# Patient Record
Sex: Male | Born: 1955 | Race: Black or African American | Hispanic: No | Marital: Single | Smoking: Current some day smoker
Health system: Southern US, Community
[De-identification: ages and names within clinical notes are randomized; demographics above are authoritative.]

## PROBLEM LIST (undated history)

## (undated) DIAGNOSIS — I10 Essential (primary) hypertension: Secondary | ICD-10-CM

## (undated) DIAGNOSIS — Z8619 Personal history of other infectious and parasitic diseases: Secondary | ICD-10-CM

## (undated) DIAGNOSIS — M1990 Unspecified osteoarthritis, unspecified site: Secondary | ICD-10-CM

## (undated) DIAGNOSIS — M2550 Pain in unspecified joint: Secondary | ICD-10-CM

## (undated) HISTORY — PX: OTHER SURGICAL HISTORY: SHX169

---

## 2000-05-24 ENCOUNTER — Other Ambulatory Visit: Admission: RE | Admit: 2000-05-24 | Discharge: 2000-05-24 | Payer: Self-pay | Admitting: Oncology

## 2000-06-18 ENCOUNTER — Encounter: Admission: RE | Admit: 2000-06-18 | Discharge: 2000-06-18 | Payer: Self-pay | Admitting: Neurosurgery

## 2000-06-18 ENCOUNTER — Encounter: Payer: Self-pay | Admitting: Neurosurgery

## 2000-06-20 ENCOUNTER — Encounter: Payer: Self-pay | Admitting: Neurosurgery

## 2000-06-20 ENCOUNTER — Inpatient Hospital Stay (HOSPITAL_COMMUNITY): Admission: AD | Admit: 2000-06-20 | Discharge: 2000-06-25 | Payer: Self-pay

## 2000-06-21 ENCOUNTER — Encounter: Payer: Self-pay | Admitting: Internal Medicine

## 2000-06-22 ENCOUNTER — Encounter: Payer: Self-pay | Admitting: Internal Medicine

## 2000-07-24 ENCOUNTER — Encounter: Admission: RE | Admit: 2000-07-24 | Discharge: 2000-07-24 | Payer: Self-pay | Admitting: Infectious Diseases

## 2000-07-31 ENCOUNTER — Encounter: Payer: Self-pay | Admitting: Neurosurgery

## 2000-07-31 ENCOUNTER — Ambulatory Visit (HOSPITAL_COMMUNITY): Admission: RE | Admit: 2000-07-31 | Discharge: 2000-07-31 | Payer: Self-pay | Admitting: Neurosurgery

## 2000-08-08 ENCOUNTER — Emergency Department (HOSPITAL_COMMUNITY): Admission: EM | Admit: 2000-08-08 | Discharge: 2000-08-08 | Payer: Self-pay | Admitting: Emergency Medicine

## 2000-08-15 ENCOUNTER — Encounter: Admission: RE | Admit: 2000-08-15 | Discharge: 2000-08-15 | Payer: Self-pay | Admitting: Infectious Diseases

## 2000-09-12 ENCOUNTER — Encounter: Admission: RE | Admit: 2000-09-12 | Discharge: 2000-09-12 | Payer: Self-pay | Admitting: Neurosurgery

## 2000-09-12 ENCOUNTER — Encounter: Payer: Self-pay | Admitting: Neurosurgery

## 2000-09-12 ENCOUNTER — Encounter: Admission: RE | Admit: 2000-09-12 | Discharge: 2000-09-12 | Payer: Self-pay | Admitting: Infectious Diseases

## 2000-11-07 ENCOUNTER — Encounter: Admission: RE | Admit: 2000-11-07 | Discharge: 2000-11-07 | Payer: Self-pay | Admitting: Infectious Diseases

## 2000-11-14 ENCOUNTER — Encounter: Payer: Self-pay | Admitting: Neurosurgery

## 2000-11-14 ENCOUNTER — Encounter: Admission: RE | Admit: 2000-11-14 | Discharge: 2000-11-14 | Payer: Self-pay | Admitting: Neurosurgery

## 2012-10-02 ENCOUNTER — Other Ambulatory Visit: Payer: Self-pay | Admitting: Orthopedic Surgery

## 2012-10-10 ENCOUNTER — Encounter (HOSPITAL_COMMUNITY): Payer: Self-pay | Admitting: Pharmacy Technician

## 2012-10-11 NOTE — Pre-Procedure Instructions (Signed)
Mitchell Curry  10/11/2012   Your procedure is scheduled on:  Fri, Sept 12 @ 12:15 PM  Report to [facility] at 10:15 AM.  Call this number if you have problems the morning of surgery: [phone]   Remember:   Do not eat food or drink liquids after midnight.   Take these medicines the morning of surgery with A SIP OF WATER: Carvedilol(Coreg)               Stop taking your Aleve.No Goody's,BC's,Ibuprofen,Aspirin,Fish Oil,or any Herbal Medications   Do not wear jewelry  Do not wear lotions, powders, or colognes. You may wear deodorant.  Men may shave face and neck.  Do not bring valuables to the hospital.  [facility] is not responsible                   for any belongings or valuables.  Contacts, dentures or bridgework may not be worn into surgery.  Leave suitcase in the car. After surgery it may be brought to your room.  For patients admitted to the hospital, checkout time is 11:00 AM the day of  discharge.   Special Instructions: Shower using CHG 2 nights before surgery and the night before surgery.  If you shower the day of surgery use CHG.  Use special wash - you have one bottle of CHG for all showers.  You should use approximately 1/3 of the bottle for each shower.   Please read over the following fact sheets that you were given: Pain Booklet, Coughing and Deep Breathing, Blood Transfusion Information, Total Joint Packet, MRSA Information and Surgical Site Infection Prevention

## 2012-10-14 ENCOUNTER — Encounter (HOSPITAL_COMMUNITY)
Admission: RE | Admit: 2012-10-14 | Discharge: 2012-10-14 | Disposition: A | Discharge status: Home / Self Care | Payer: Managed Care, Other (non HMO) | Source: Ambulatory Visit | Attending: Orthopedic Surgery | Admitting: Orthopedic Surgery

## 2012-10-14 ENCOUNTER — Encounter (HOSPITAL_COMMUNITY): Payer: Self-pay

## 2012-10-14 DIAGNOSIS — Z01812 Encounter for preprocedural laboratory examination: Secondary | ICD-10-CM | POA: Insufficient documentation

## 2012-10-14 DIAGNOSIS — Z01818 Encounter for other preprocedural examination: Secondary | ICD-10-CM | POA: Insufficient documentation

## 2012-10-14 DIAGNOSIS — Z01810 Encounter for preprocedural cardiovascular examination: Secondary | ICD-10-CM | POA: Insufficient documentation

## 2012-10-14 HISTORY — DX: Unspecified osteoarthritis, unspecified site: M19.90

## 2012-10-14 HISTORY — DX: Pain in unspecified joint: M25.50

## 2012-10-14 HISTORY — DX: Essential (primary) hypertension: I10

## 2012-10-14 HISTORY — DX: Personal history of other infectious and parasitic diseases: Z86.19

## 2012-10-14 LAB — SURGICAL PCR SCREEN
MRSA, PCR: NEGATIVE
Staphylococcus aureus: NEGATIVE

## 2012-10-14 LAB — URINALYSIS, ROUTINE W REFLEX MICROSCOPIC
Hgb urine dipstick: NEGATIVE
Nitrite: NEGATIVE
Specific Gravity, Urine: 1.01 (ref 1.005–1.030)
Urobilinogen, UA: 0.2 mg/dL (ref 0.0–1.0)
pH: 7.5 (ref 5.0–8.0)

## 2012-10-14 LAB — BASIC METABOLIC PANEL
BUN: 13 mg/dL (ref 6–23)
CO2: 26 mEq/L (ref 19–32)
Calcium: 10.1 mg/dL (ref 8.4–10.5)
Chloride: 101 mEq/L (ref 96–112)
Creatinine, Ser: 0.97 mg/dL (ref 0.50–1.35)
Glucose, Bld: 100 mg/dL — ABNORMAL HIGH (ref 70–99)

## 2012-10-14 LAB — CBC WITH DIFFERENTIAL/PLATELET
Basophils Absolute: 0 10*3/uL (ref 0.0–0.1)
Eosinophils Absolute: 0.4 10*3/uL (ref 0.0–0.7)
Eosinophils Relative: 7 % — ABNORMAL HIGH (ref 0–5)
HCT: 37.6 % — ABNORMAL LOW (ref 39.0–52.0)
Lymphocytes Relative: 48 % — ABNORMAL HIGH (ref 12–46)
MCH: 33.3 pg (ref 26.0–34.0)
MCHC: 36.7 g/dL — ABNORMAL HIGH (ref 30.0–36.0)
MCV: 90.8 fL (ref 78.0–100.0)
Monocytes Absolute: 0.7 10*3/uL (ref 0.1–1.0)
RDW: 12.7 % (ref 11.5–15.5)
WBC: 5.1 10*3/uL (ref 4.0–10.5)

## 2012-10-14 LAB — TYPE AND SCREEN: Antibody Screen: NEGATIVE

## 2012-10-14 LAB — ABO/RH: ABO/RH(D): O POS

## 2012-10-14 MED ORDER — CHLORHEXIDINE GLUCONATE 4 % EX LIQD: 60.0000 mL | Freq: Once | CUTANEOUS | Status: DC

## 2012-10-14 NOTE — Progress Notes (Signed)
Pt doesn't have a cardiologist  Denies ever having an echo/stress test/heart cath  Medical MD is Dr.Vyas  Denies ekg or cxr in past yr

## 2012-10-15 ENCOUNTER — Other Ambulatory Visit: Payer: Self-pay | Admitting: Orthopedic Surgery

## 2012-10-15 NOTE — H&P (Signed)
TOTAL KNEE ADMISSION H&P  Patient is being admitted for left total knee arthroplasty.  Subjective:  Chief Complaint:left knee pain.  HPI: Mitchell Curry, 57 y.o. male, has a history of pain and functional disability in the left knee due to arthritis and has failed non-surgical conservative treatments for greater than 12 weeks to includeNSAID's and/or analgesics and activity modification.  Onset of symptoms wa s gradual, starting many years ago with gradually worsening course since that time. The patient noted no past surgery on the left knee(s).  Patient currently rates pain in the left knee(s) at severe with activity. Patient has night pain, worsening of pain with activity and weight bearing, pain that interferes with activities of daily living and pain with passive range of motion.  Patient has evidence of joint subluxation and joint space narrowing by imaging studies.  There is no active infection.  Patient is seen in consultation from Dr. Salvatore Marvel for end-stage arthritis the left knee with far varus deformity, as well as some stretching of the lateral collateral ligament with lateral knee pain as well as mid medial knee pain now.  He works at a [employer], [employer]. He stands on his feet 10-12 hours a day and reports that the knee is becoming more unstable and painful.  He can only walk one to two blocks before he must sit down.  The pain wakes him at night and if he goes shopping, he holds onto the shopping cart.  Patient denies any history of heart attack, stroke, or significant cardiovascular disease but does have a history of hypertension that is controlled.  There are no active problems to display for this patient.  Past Medical History  Diagnosis Date  . Hypertension     takes Carvedilol daily  . Arthritis   . Joint pain   . History of shingles     Past Surgical History  Procedure Laterality Date  . Right wrist surgery       x 2    No prescriptions prior to  admission   No Known Allergies  History  Substance Use Topics  . Smoking status: Current Some Day Smoker    Types: Cigars  . Smokeless tobacco: Not on file  . Alcohol Use: Yes     Comment: beer on weekends    No family history on file.   Review of Systems  Constitutional: Negative.   HENT: Negative.   Eyes: Negative.   Respiratory: Negative.   Gastrointestinal: Negative.   Musculoskeletal: Positive for joint pain (left knee).  Skin: Negative.   Neurological: Negative.   Psychiatric/Behavioral: Negative.     Objective:  Physical Exam  Constitutional: He is oriented to person, place, and time. He appears well-developed and well-nourished.  HENT:  Head: Normocephalic and atraumatic.  Eyes: EOM are normal. Pupils are equal, round, and reactive to light.  Neck: Normal range of motion. Neck supple.  Cardiovascular: Intact distal pulses.   Respiratory: Effort normal and breath sounds normal.  Musculoskeletal: He exhibits tenderness (left knee pain).  Neurological: He is alert and oriented to person, place, and time. He has normal reflexes.  Skin: Skin is warm and dry.  Psychiatric: He has a normal mood and affect. His behavior is normal. Judgment and thought content normal.    Vital signs in last 24 hours:    Labs:   There is no height or weight on file to calculate BMI.   Imaging Review X-rays from the [facility] are reviewed and  show medial compartment end-stage arthritis, 1 cm lateral subluxation of tibia beneath the femur, as well as 20-30% he'll elongate of the lateral collateral ligament.  Assessment/Plan:   End-stage arthritis left knee, far varus deformity, early stress of the lateral collateral ligament from the far varus deformity  The patient history, physical examination, clinical judgment of the provider and imaging studies are consistent with end stage degenerative joint disease of the left knee(s) and total knee arthroplasty is deemed  medically necessary. The treatment options including medical management, injection therapy arthroscopy and arthroplasty were discussed at length. The risks and benefits of total knee arthroplasty were presented and reviewed. The risks due to aseptic loosening, infection, stiffness, patella tracking problems, thromboembolic complications and other imponderables were discussed. The patient acknowledged the explanation, agreed to proceed with the plan and consent was signed. Patient is being admitted for inpatient treatment for surgery, pain control, PT, OT, prophylactic antibiotics, VTE prophylaxis, progressive ambulation and ADL's and discharge planning. The patient is planning to be discharged home with home health services  The patient should plan for knee replacement sooner than later to protect his lateral collateral ligament.  Models were brought into the room the procedure, risks and benefits were discussed with the patient.  We will also have the T C3 femur stem and MBT trays available to stabilize the lateral side of the joint if needed.

## 2012-10-15 NOTE — Progress Notes (Addendum)
Anesthesia chart review:  Patient is a 57 year old male scheduled for left TKA on 10/18/12 by Dr. Turner Daniels.  History includes smoking, HTN, shingles, arthritis.  PCP is Dr. Sherril Croon.  EKG on 10/14/12 showed SB @ 56 bpm, possible LAE, LVH.  CXR on 10/14/12 showed: Hyperinflated lungs with question nipple shadows; repeat PA chest radiograph with nipple markers recommended to exclude pulmonary nodule.  Report called to Agustin Cree at Dr. Wadie Lessen office.  Defer orders/recommendations to Dr. Turner Daniels.  Preoperative labs noted.    Anticipate that he can proceed as planned.  [facility]/Anesthesiology Phone [phone] 10/15/2012 10:33 AM  Addendum: 10/15/2012 1:39 PM Dr. Turner Daniels is going to have patient repeat CXR with nipple markers on the day of surgery.

## 2012-10-17 MED ORDER — CEFAZOLIN SODIUM-DEXTROSE 2-3 GM-% IV SOLR
2.0000 g | INTRAVENOUS | Status: AC
  Administered 2012-10-18: 2 g via INTRAVENOUS
  Filled 2012-10-17: qty 50

## 2012-10-17 NOTE — Progress Notes (Signed)
Surgery time change noted pt called and informed to be here at 0845 for surgery at 1045. Teach back complete.

## 2012-10-18 ENCOUNTER — Encounter (HOSPITAL_COMMUNITY): Payer: Self-pay | Admitting: Vascular Surgery

## 2012-10-18 ENCOUNTER — Encounter (HOSPITAL_COMMUNITY): Payer: Self-pay | Admitting: *Deleted

## 2012-10-18 ENCOUNTER — Inpatient Hospital Stay (HOSPITAL_COMMUNITY): Payer: Managed Care, Other (non HMO)

## 2012-10-18 ENCOUNTER — Encounter (HOSPITAL_COMMUNITY)
Admission: RE | Disposition: A | Discharge status: Home / Self Care | Payer: Self-pay | Source: Ambulatory Visit | Attending: Orthopedic Surgery

## 2012-10-18 ENCOUNTER — Inpatient Hospital Stay (HOSPITAL_COMMUNITY)
Admission: RE | Admit: 2012-10-18 | Discharge: 2012-10-20 | DRG: 470 | Disposition: A | Discharge status: Home / Self Care | Payer: Managed Care, Other (non HMO) | Source: Ambulatory Visit | Attending: Orthopedic Surgery | Admitting: Orthopedic Surgery

## 2012-10-18 ENCOUNTER — Inpatient Hospital Stay (HOSPITAL_COMMUNITY): Payer: Managed Care, Other (non HMO) | Admitting: Anesthesiology

## 2012-10-18 DIAGNOSIS — I10 Essential (primary) hypertension: Secondary | ICD-10-CM | POA: Diagnosis present

## 2012-10-18 DIAGNOSIS — M1710 Unilateral primary osteoarthritis, unspecified knee: Principal | ICD-10-CM | POA: Diagnosis present

## 2012-10-18 DIAGNOSIS — M1712 Unilateral primary osteoarthritis, left knee: Secondary | ICD-10-CM | POA: Diagnosis present

## 2012-10-18 DIAGNOSIS — F17200 Nicotine dependence, unspecified, uncomplicated: Secondary | ICD-10-CM | POA: Diagnosis present

## 2012-10-18 HISTORY — PX: TOTAL KNEE ARTHROPLASTY: SHX125

## 2012-10-18 SURGERY — ARTHROPLASTY, KNEE, TOTAL
Anesthesia: Regional | Site: Knee | Laterality: Left | Wound class: Clean

## 2012-10-18 MED ORDER — KCL IN DEXTROSE-NACL 20-5-0.45 MEQ/L-%-% IV SOLN
INTRAVENOUS | Status: DC
  Administered 2012-10-18 – 2012-10-19 (×4): via INTRAVENOUS
  Filled 2012-10-18 (×8): qty 1000

## 2012-10-18 MED ORDER — HYDRALAZINE HCL 20 MG/ML IJ SOLN
INTRAMUSCULAR | Status: AC
  Filled 2012-10-18: qty 1

## 2012-10-18 MED ORDER — FENTANYL CITRATE 0.05 MG/ML IJ SOLN
INTRAMUSCULAR | Status: DC | PRN
  Administered 2012-10-18 (×2): 50 ug via INTRAVENOUS
  Administered 2012-10-18: 100 ug via INTRAVENOUS
  Administered 2012-10-18: 50 ug via INTRAVENOUS

## 2012-10-18 MED ORDER — CEFUROXIME SODIUM 1.5 G IJ SOLR
INTRAMUSCULAR | Status: DC | PRN
  Administered 2012-10-18: 1.5 g

## 2012-10-18 MED ORDER — LACTATED RINGERS IV SOLN
INTRAVENOUS | Status: DC | PRN
  Administered 2012-10-18 (×2): via INTRAVENOUS

## 2012-10-18 MED ORDER — DOCUSATE SODIUM 100 MG PO CAPS
100.0000 mg | ORAL_CAPSULE | Freq: Two times a day (BID) | ORAL | Status: DC
  Administered 2012-10-18 – 2012-10-20 (×4): 100 mg via ORAL
  Filled 2012-10-18 (×5): qty 1

## 2012-10-18 MED ORDER — BUPIVACAINE LIPOSOME 1.3 % IJ SUSP
20.0000 mL | INTRAMUSCULAR | Status: DC
  Filled 2012-10-18: qty 20

## 2012-10-18 MED ORDER — 0.9 % SODIUM CHLORIDE (POUR BTL) OPTIME
TOPICAL | Status: DC | PRN
  Administered 2012-10-18: 1000 mL

## 2012-10-18 MED ORDER — METHOCARBAMOL 500 MG PO TABS
500.0000 mg | ORAL_TABLET | Freq: Four times a day (QID) | ORAL | Status: DC | PRN
  Administered 2012-10-18 – 2012-10-20 (×5): 500 mg via ORAL
  Filled 2012-10-18 (×7): qty 1

## 2012-10-18 MED ORDER — LABETALOL HCL 5 MG/ML IV SOLN
5.0000 mg | INTRAVENOUS | Status: DC | PRN
  Administered 2012-10-18 (×2): 5 mg via INTRAVENOUS

## 2012-10-18 MED ORDER — METOCLOPRAMIDE HCL 5 MG PO TABS
5.0000 mg | ORAL_TABLET | Freq: Three times a day (TID) | ORAL | Status: DC | PRN
  Filled 2012-10-18: qty 2

## 2012-10-18 MED ORDER — ASPIRIN EC 325 MG PO TBEC
325.0000 mg | DELAYED_RELEASE_TABLET | Freq: Every day | ORAL | Status: DC
  Administered 2012-10-19 – 2012-10-20 (×2): 325 mg via ORAL
  Filled 2012-10-18 (×3): qty 1

## 2012-10-18 MED ORDER — OXYCODONE HCL 5 MG/5ML PO SOLN: 5.0000 mg | Freq: Once | ORAL | Status: DC | PRN

## 2012-10-18 MED ORDER — OXYCODONE-ACETAMINOPHEN 5-325 MG PO TABS: 1.0000 | ORAL_TABLET | ORAL | Status: AC | PRN

## 2012-10-18 MED ORDER — ALUM & MAG HYDROXIDE-SIMETH 200-200-20 MG/5ML PO SUSP
30.0000 mL | ORAL | Status: DC | PRN
  Filled 2012-10-18: qty 30

## 2012-10-18 MED ORDER — ONDANSETRON HCL 4 MG/2ML IJ SOLN: 4.0000 mg | Freq: Four times a day (QID) | INTRAMUSCULAR | Status: DC | PRN

## 2012-10-18 MED ORDER — LACTATED RINGERS IV SOLN
INTRAVENOUS | Status: DC
  Administered 2012-10-18: 10:00:00 via INTRAVENOUS

## 2012-10-18 MED ORDER — FENTANYL CITRATE 0.05 MG/ML IJ SOLN
25.0000 ug | INTRAMUSCULAR | Status: DC | PRN
  Administered 2012-10-18 (×4): 25 ug via INTRAVENOUS

## 2012-10-18 MED ORDER — METOCLOPRAMIDE HCL 5 MG/ML IJ SOLN: 5.0000 mg | Freq: Three times a day (TID) | INTRAMUSCULAR | Status: DC | PRN

## 2012-10-18 MED ORDER — LIDOCAINE HCL (CARDIAC) 20 MG/ML IV SOLN
INTRAVENOUS | Status: DC | PRN
  Administered 2012-10-18: 80 mg via INTRAVENOUS

## 2012-10-18 MED ORDER — MIDAZOLAM HCL 5 MG/5ML IJ SOLN
INTRAMUSCULAR | Status: DC | PRN
  Administered 2012-10-18: 2 mg via INTRAVENOUS

## 2012-10-18 MED ORDER — DIPHENHYDRAMINE HCL 12.5 MG/5ML PO ELIX: 12.5000 mg | ORAL_SOLUTION | ORAL | Status: DC | PRN

## 2012-10-18 MED ORDER — ONDANSETRON HCL 4 MG/2ML IJ SOLN
INTRAMUSCULAR | Status: DC | PRN
  Administered 2012-10-18: 4 mg via INTRAVENOUS

## 2012-10-18 MED ORDER — ACETAMINOPHEN 650 MG RE SUPP: 650.0000 mg | Freq: Four times a day (QID) | RECTAL | Status: DC | PRN

## 2012-10-18 MED ORDER — HYDRALAZINE HCL 20 MG/ML IJ SOLN
10.0000 mg | Freq: Once | INTRAMUSCULAR | Status: AC
  Administered 2012-10-18: 10:00:00 via INTRAVENOUS
  Filled 2012-10-18: qty 0.5

## 2012-10-18 MED ORDER — MENTHOL 3 MG MT LOZG: 1.0000 | LOZENGE | OROMUCOSAL | Status: DC | PRN

## 2012-10-18 MED ORDER — NEOSTIGMINE METHYLSULFATE 1 MG/ML IJ SOLN
INTRAMUSCULAR | Status: DC | PRN
  Administered 2012-10-18: 3 mg via INTRAVENOUS

## 2012-10-18 MED ORDER — PROPOFOL 10 MG/ML IV BOLUS
INTRAVENOUS | Status: DC | PRN
  Administered 2012-10-18: 140 mg via INTRAVENOUS

## 2012-10-18 MED ORDER — METHOCARBAMOL 500 MG PO TABS: 500.0000 mg | ORAL_TABLET | Freq: Two times a day (BID) | ORAL | Status: AC

## 2012-10-18 MED ORDER — METHOCARBAMOL 100 MG/ML IJ SOLN
500.0000 mg | Freq: Four times a day (QID) | INTRAVENOUS | Status: DC | PRN
  Filled 2012-10-18: qty 5

## 2012-10-18 MED ORDER — EPHEDRINE SULFATE 50 MG/ML IJ SOLN
INTRAMUSCULAR | Status: DC | PRN
  Administered 2012-10-18: 10 mg via INTRAVENOUS

## 2012-10-18 MED ORDER — ACETAMINOPHEN 325 MG PO TABS: 650.0000 mg | ORAL_TABLET | Freq: Four times a day (QID) | ORAL | Status: DC | PRN

## 2012-10-18 MED ORDER — BUPIVACAINE-EPINEPHRINE PF 0.5-1:200000 % IJ SOLN
INTRAMUSCULAR | Status: DC | PRN
  Administered 2012-10-18: 20 mL

## 2012-10-18 MED ORDER — HYDROMORPHONE HCL PF 1 MG/ML IJ SOLN
1.0000 mg | INTRAMUSCULAR | Status: DC | PRN
  Administered 2012-10-18 – 2012-10-19 (×4): 1 mg via INTRAVENOUS
  Filled 2012-10-18 (×4): qty 1

## 2012-10-18 MED ORDER — PHENYLEPHRINE HCL 10 MG/ML IJ SOLN
INTRAMUSCULAR | Status: DC | PRN
  Administered 2012-10-18 (×4): 80 ug via INTRAVENOUS

## 2012-10-18 MED ORDER — SENNOSIDES-DOCUSATE SODIUM 8.6-50 MG PO TABS: 1.0000 | ORAL_TABLET | Freq: Every evening | ORAL | Status: DC | PRN

## 2012-10-18 MED ORDER — BISACODYL 5 MG PO TBEC: 5.0000 mg | DELAYED_RELEASE_TABLET | Freq: Every day | ORAL | Status: DC | PRN

## 2012-10-18 MED ORDER — PHENOL 1.4 % MT LIQD: 1.0000 | OROMUCOSAL | Status: DC | PRN

## 2012-10-18 MED ORDER — ONDANSETRON HCL 4 MG PO TABS
4.0000 mg | ORAL_TABLET | Freq: Four times a day (QID) | ORAL | Status: DC | PRN
  Administered 2012-10-19: 4 mg via ORAL
  Filled 2012-10-18: qty 1

## 2012-10-18 MED ORDER — SODIUM CHLORIDE 0.9 % IJ SOLN
INTRAMUSCULAR | Status: DC | PRN
  Administered 2012-10-18: 12:00:00

## 2012-10-18 MED ORDER — CARVEDILOL 25 MG PO TABS
25.0000 mg | ORAL_TABLET | Freq: Two times a day (BID) | ORAL | Status: DC
  Filled 2012-10-18 (×2): qty 1

## 2012-10-18 MED ORDER — OXYCODONE HCL 5 MG PO TABS
5.0000 mg | ORAL_TABLET | ORAL | Status: DC | PRN
  Administered 2012-10-18 – 2012-10-20 (×7): 10 mg via ORAL
  Filled 2012-10-18 (×8): qty 2

## 2012-10-18 MED ORDER — OXYCODONE HCL 5 MG PO TABS: 5.0000 mg | ORAL_TABLET | Freq: Once | ORAL | Status: DC | PRN

## 2012-10-18 MED ORDER — FENTANYL CITRATE 0.05 MG/ML IJ SOLN
50.0000 ug | INTRAMUSCULAR | Status: DC | PRN
  Administered 2012-10-18: 50 ug via INTRAVENOUS
  Filled 2012-10-18: qty 2

## 2012-10-18 MED ORDER — FENTANYL CITRATE 0.05 MG/ML IJ SOLN
INTRAMUSCULAR | Status: AC
  Filled 2012-10-18: qty 2

## 2012-10-18 MED ORDER — ROCURONIUM BROMIDE 100 MG/10ML IV SOLN
INTRAVENOUS | Status: DC | PRN
  Administered 2012-10-18: 50 mg via INTRAVENOUS

## 2012-10-18 MED ORDER — MAGNESIUM CITRATE PO SOLN
1.0000 | Freq: Once | ORAL | Status: AC | PRN
  Filled 2012-10-18: qty 296

## 2012-10-18 MED ORDER — CARVEDILOL 25 MG PO TABS
25.0000 mg | ORAL_TABLET | Freq: Two times a day (BID) | ORAL | Status: DC
  Administered 2012-10-18 – 2012-10-20 (×4): 25 mg via ORAL
  Filled 2012-10-18 (×6): qty 1

## 2012-10-18 MED ORDER — CEFUROXIME SODIUM 1.5 G IJ SOLR
INTRAMUSCULAR | Status: AC
  Filled 2012-10-18: qty 1.5

## 2012-10-18 MED ORDER — ASPIRIN EC 325 MG PO TBEC: 325.0000 mg | DELAYED_RELEASE_TABLET | Freq: Two times a day (BID) | ORAL | Status: AC

## 2012-10-18 MED ORDER — LABETALOL HCL 5 MG/ML IV SOLN
INTRAVENOUS | Status: AC
  Filled 2012-10-18: qty 4

## 2012-10-18 MED ORDER — GLYCOPYRROLATE 0.2 MG/ML IJ SOLN
INTRAMUSCULAR | Status: DC | PRN
  Administered 2012-10-18: .5 mg via INTRAVENOUS
  Administered 2012-10-18: 0.2 mg via INTRAVENOUS
  Administered 2012-10-18 (×2): 0.1 mg via INTRAVENOUS

## 2012-10-18 MED ORDER — SODIUM CHLORIDE 0.9 % IR SOLN
Status: DC | PRN
  Administered 2012-10-18: 3000 mL

## 2012-10-18 MED ORDER — MIDAZOLAM HCL 2 MG/2ML IJ SOLN
1.0000 mg | INTRAMUSCULAR | Status: DC | PRN
  Administered 2012-10-18: 10:00:00 via INTRAVENOUS
  Filled 2012-10-18: qty 2

## 2012-10-18 MED ORDER — DEXTROSE-NACL 5-0.45 % IV SOLN: INTRAVENOUS | Status: DC

## 2012-10-18 MED ORDER — CARVEDILOL 25 MG PO TABS
25.0000 mg | ORAL_TABLET | Freq: Once | ORAL | Status: AC
  Administered 2012-10-18: 25 mg via ORAL
  Filled 2012-10-18: qty 1

## 2012-10-18 SURGICAL SUPPLY — 55 items
BANDAGE ELASTIC 6 VELCRO ST LF (GAUZE/BANDAGES/DRESSINGS) ×2 IMPLANT
BANDAGE ESMARK 6X9 LF (GAUZE/BANDAGES/DRESSINGS) ×1 IMPLANT
BLADE SAG 18X100X1.27 (BLADE) ×2 IMPLANT
BLADE SAW SGTL 13X75X1.27 (BLADE) ×2 IMPLANT
BLADE SURG ROTATE 9660 (MISCELLANEOUS) IMPLANT
BNDG ELASTIC 6X10 VLCR STRL LF (GAUZE/BANDAGES/DRESSINGS) ×2 IMPLANT
BNDG ESMARK 6X9 LF (GAUZE/BANDAGES/DRESSINGS) ×2
BOWL SMART MIX CTS (DISPOSABLE) ×2 IMPLANT
CAPT RP KNEE ×2 IMPLANT
CEMENT HV SMART SET (Cement) ×4 IMPLANT
CLOTH BEACON ORANGE TIMEOUT ST (SAFETY) ×2 IMPLANT
CONT SPEC 4OZ CLIKSEAL STRL BL (MISCELLANEOUS) ×2 IMPLANT
COVER SURGICAL LIGHT HANDLE (MISCELLANEOUS) ×2 IMPLANT
CUFF TOURNIQUET SINGLE 34IN LL (TOURNIQUET CUFF) ×2 IMPLANT
CUFF TOURNIQUET SINGLE 44IN (TOURNIQUET CUFF) IMPLANT
DRAPE EXTREMITY T 121X128X90 (DRAPE) ×2 IMPLANT
DRAPE U-SHAPE 47X51 STRL (DRAPES) ×2 IMPLANT
DRSG PAD ABDOMINAL 8X10 ST (GAUZE/BANDAGES/DRESSINGS) ×2 IMPLANT
DURAPREP 26ML APPLICATOR (WOUND CARE) ×2 IMPLANT
ELECT REM PT RETURN 9FT ADLT (ELECTROSURGICAL) ×2
ELECTRODE REM PT RTRN 9FT ADLT (ELECTROSURGICAL) ×1 IMPLANT
EVACUATOR 1/8 PVC DRAIN (DRAIN) ×2 IMPLANT
GAUZE XEROFORM 1X8 LF (GAUZE/BANDAGES/DRESSINGS) ×2 IMPLANT
GLOVE BIO SURGEON STRL SZ7.5 (GLOVE) ×2 IMPLANT
GLOVE BIO SURGEON STRL SZ8.5 (GLOVE) ×4 IMPLANT
GLOVE BIOGEL PI IND STRL 8 (GLOVE) ×2 IMPLANT
GLOVE BIOGEL PI IND STRL 9 (GLOVE) ×1 IMPLANT
GLOVE BIOGEL PI INDICATOR 8 (GLOVE) ×2
GLOVE BIOGEL PI INDICATOR 9 (GLOVE) ×1
GOWN PREVENTION PLUS XLARGE (GOWN DISPOSABLE) ×2 IMPLANT
GOWN STRL NON-REIN LRG LVL3 (GOWN DISPOSABLE) ×2 IMPLANT
GOWN STRL REIN XL XLG (GOWN DISPOSABLE) ×4 IMPLANT
HANDPIECE INTERPULSE COAX TIP (DISPOSABLE) ×1
HOOD PEEL AWAY FACE SHEILD DIS (HOOD) ×6 IMPLANT
KIT BASIN OR (CUSTOM PROCEDURE TRAY) ×2 IMPLANT
KIT ROOM TURNOVER OR (KITS) ×2 IMPLANT
MANIFOLD NEPTUNE II (INSTRUMENTS) ×2 IMPLANT
NS IRRIG 1000ML POUR BTL (IV SOLUTION) ×2 IMPLANT
PACK TOTAL JOINT (CUSTOM PROCEDURE TRAY) ×2 IMPLANT
PAD ARMBOARD 7.5X6 YLW CONV (MISCELLANEOUS) ×4 IMPLANT
PADDING CAST COTTON 6X4 STRL (CAST SUPPLIES) ×2 IMPLANT
SET HNDPC FAN SPRY TIP SCT (DISPOSABLE) ×1 IMPLANT
SPONGE GAUZE 4X4 12PLY (GAUZE/BANDAGES/DRESSINGS) ×2 IMPLANT
STAPLER VISISTAT 35W (STAPLE) ×2 IMPLANT
SUCTION FRAZIER TIP 10 FR DISP (SUCTIONS) ×2 IMPLANT
SUT VIC AB 0 CTX 36 (SUTURE) ×1
SUT VIC AB 0 CTX36XBRD ANTBCTR (SUTURE) ×1 IMPLANT
SUT VIC AB 1 CTX 36 (SUTURE) ×1
SUT VIC AB 1 CTX36XBRD ANBCTR (SUTURE) ×1 IMPLANT
SUT VIC AB 2-0 CT1 27 (SUTURE) ×1
SUT VIC AB 2-0 CT1 TAPERPNT 27 (SUTURE) ×1 IMPLANT
TOWEL OR 17X24 6PK STRL BLUE (TOWEL DISPOSABLE) ×2 IMPLANT
TOWEL OR 17X26 10 PK STRL BLUE (TOWEL DISPOSABLE) ×2 IMPLANT
TRAY FOLEY CATH 14FR (SET/KITS/TRAYS/PACK) ×2 IMPLANT
WATER STERILE IRR 1000ML POUR (IV SOLUTION) ×4 IMPLANT

## 2012-10-18 NOTE — Anesthesia Preprocedure Evaluation (Signed)
Anesthesia Evaluation  Patient identified by MRN, date of birth, ID band Patient awake    Reviewed: Allergy & Precautions, H&P , NPO status , Patient's Chart, lab work & pertinent test results  Airway Mallampati: II  Neck ROM: full    Dental   Pulmonary Current Smoker,          Cardiovascular hypertension,     Neuro/Psych    GI/Hepatic   Endo/Other    Renal/GU      Musculoskeletal  (+) Arthritis -,   Abdominal   Peds  Hematology   Anesthesia Other Findings   Reproductive/Obstetrics                           Anesthesia Physical Anesthesia Plan  ASA: II  Anesthesia Plan: General and Regional   Post-op Pain Management: MAC Combined w/ Regional for Post-op pain   Induction: Intravenous  Airway Management Planned: LMA  Additional Equipment:   Intra-op Plan:   Post-operative Plan:   Informed Consent: I have reviewed the patients History and Physical, chart, labs and discussed the procedure including the risks, benefits and alternatives for the proposed anesthesia with the patient or authorized representative who has indicated his/her understanding and acceptance.     Plan Discussed with: CRNA, Anesthesiologist and Surgeon  Anesthesia Plan Comments:         Anesthesia Quick Evaluation

## 2012-10-18 NOTE — Transfer of Care (Signed)
Immediate Anesthesia Transfer of Care Note  Patient: Mitchell Curry  Procedure(s) Performed: Procedure(s): LEFT TOTAL KNEE ARTHROPLASTY (Left)  Patient Location: PACU  Anesthesia Type:General  Level of Consciousness: awake, alert  and oriented  Airway & Oxygen Therapy: Patient Spontanous Breathing and Patient connected to nasal cannula oxygen  Post-op Assessment: Report given to PACU RN and Post -op Vital signs reviewed and stable  Post vital signs: Reviewed and stable  Complications: No apparent anesthesia complications

## 2012-10-18 NOTE — Progress Notes (Signed)
25mg  PO Coreg and 10mg  IV Hydralazine given for elevated BP.

## 2012-10-18 NOTE — Progress Notes (Signed)
Dr. Chaney Malling called and informed of elevated blood pressure with orders received for 10mg  IV Hydralazine, will monitor. Pt reports not taking PO Coreg this morning which was also ordered per protocol.

## 2012-10-18 NOTE — Anesthesia Procedure Notes (Signed)
Anesthesia Regional Block:  Adductor canal block  Pre-Anesthetic Checklist: ,, timeout performed, Correct Patient, Correct Site, Correct Laterality, Correct Procedure, Correct Position, site marked, Risks and benefits discussed,  Surgical consent,  Pre-op evaluation,  At surgeon's request and post-op pain management  Laterality: Left  Prep: chloraprep       Needles:  Injection technique: Single-shot  Needle Type: Echogenic Needle     Needle Length:cm 9 cm Needle Gauge: 21 and 21 G    Additional Needles:  Procedures: ultrasound guided (picture in chart) Adductor canal block Narrative:  Start time: 10/18/2012 10:18 AM End time: 10/18/2012 10:24 AM Injection made incrementally with aspirations every 5 mL.  Performed by: Personally  Anesthesiologist: Dr Chaney Malling  Additional Notes: Pt tolerated the procedure well.

## 2012-10-18 NOTE — Progress Notes (Signed)
UR COMPLETED  

## 2012-10-18 NOTE — Op Note (Signed)
PATIENT ID:      Mitchell Curry  MRN:     [id number] DOB/AGE:    1955/10/22 / 57 y.o.       OPERATIVE REPORT    DATE OF PROCEDURE:  10/18/2012       PREOPERATIVE DIAGNOSIS:   LEFT KNEE OSTEOARTHRITIS VARUS      There is no weight on file to calculate BMI.                                                        POSTOPERATIVE DIAGNOSIS:   LEFT KNEE OSTEOARTHRITIS VARUS                                                                      PROCEDURE:  Procedure(s): LEFT TOTAL KNEE ARTHROPLASTY Using Depuy Sigma RP implants #4R Femur, #5Tibia, 10mm sigma RP bearing, 38 Patella     SURGEON: Oretha Weismann J    ASSISTANT:   Eric K. [employer]   (Present and scrubbed throughout the case, critical for assistance with exposure, retraction, instrumentation, and closure.)         ANESTHESIA: GET Exparel  DRAINS: foley, 2 medium hemovac in knee   TOURNIQUET TIME:   COMPLICATIONS:  None     SPECIMENS: None   INDICATIONS FOR PROCEDURE: The patient has  LEFT KNEE OSTEOARTHRITIS VARUS, varus deformities, XR shows bone on bone arthritis. Patient has failed all conservative measures including anti-inflammatory medicines, narcotics, attempts at  exercise and weight loss, cortisone injections and viscosupplementation.  Risks and benefits of surgery have been discussed, questions answered.   DESCRIPTION OF PROCEDURE: The patient identified by armband, received  IV antibiotics, in the holding area at [facility] Patient taken to the operating room, appropriate anesthetic  monitors were attached, and general endotracheal anesthesia induced with  the patient in supine position, Foley catheter was inserted. Tourniquet  applied high to the operative thigh. Lateral post and foot positioner  applied to the table, the lower extremity was then prepped and draped  in usual sterile fashion from the ankle to the tourniquet. Time-out procedure was performed. The limb was wrapped with an Esmarch bandage and  the tourniquet inflated to 350 mmHg. We began the operation by making the anterior midline incision starting at handbreadth above the patella going over the patella 1 cm medial to and  4 cm distal to the tibial tubercle. Small bleeders in the skin and the  subcutaneous tissue identified and cauterized. Transverse retinaculum was incised and reflected medially and a medial parapatellar arthrotomy was accomplished. the patella was everted and theprepatellar fat pad resected. The superficial medial collateral  ligament was then elevated from anterior to posterior along the proximal  flare of the tibia and anterior half of the menisci resected. The knee was hyperflexed exposing bone on bone arthritis. Peripheral and notch osteophytes as well as the cruciate ligaments were then resected. We continued to  work our way around posteriorly along the proximal tibia, and externally  rotated the tibia subluxing it out from underneath the femur. A McHale  retractor  was placed through the notch and a lateral Hohmann retractor  placed, and we then drilled through the proximal tibia in line with the  axis of the tibia followed by an intramedullary guide rod and 2-degree  posterior slope cutting guide. The tibial cutting guide was pinned into place  allowing resection of -1 mm of bone medially and about 11 mm of bone  laterally because of his varus deformity. Satisfied with the tibial resection, we then  entered the distal femur 2 mm anterior to the PCL origin with the  intramedullary guide rod and applied the distal femoral cutting guide  set at 11mm, with 5 degrees of valgus. This was pinned along the  epicondylar axis. At this point, the distal femoral cut was accomplished without difficulty. We then sized for a #4R femoral component and pinned the guide in 3 degrees of external rotation.The chamfer cutting guide was pinned into place. The anterior, posterior, and chamfer cuts were accomplished without difficulty  followed by  the Sigma RP box cutting guide and the box cut. We also removed posterior osteophytes from the posterior femoral condyles. At this  time, the knee was brought into full extension. We checked our  extension and flexion gaps and found them symmetric at 10mm. We removed a lead pellet from the post capsule, from >40 y ago.  The patella thickness measured at 25 mm. We set the cutting guide at 15 and removed the posterior 10 mm  of the patella, sized for a 38 button and drilled the lollipop. The knee  was then once again hyperflexed exposing the proximal tibia. We sized for a #5 tibial base plate, applied the smokestack and the conical reamer followed by the the Delta fin keel punch. We then hammered into place the Sigma RP trial femoral component, inserted a 10-mm trial bearing, trial patellar button, and took the knee through range of motion from 0-130 degrees. No thumb pressure was required for patellar  tracking. At this point, all trial components were removed, a double batch of DePuy HV cement with 1500 mg of Zinacef was mixed and applied to all bony metallic mating surfaces except for the posterior condyles of the femur itself. In order, we  hammered into place the tibial tray and removed excess cement, the femoral component and removed excess cement, a 10-mm Sigma RP bearing  was inserted, and the knee brought to full extension with compression.  The patellar button was clamped into place, and excess cement  removed. While the cement cured the wound was irrigated out with normal saline solution pulse lavage, and medium Hemovac drains were placed from an anterolateral  approach. Ligament stability and patellar tracking were checked and found to be excellent. The parapatellar arthrotomy was closed with  running #1 Vicryl suture. The subcutaneous tissue with 0 and 2-0 undyed  Vicryl suture, and the skin with skin staples. A dressing of Xeroform,  4 x 4, dressing sponges, Webril, and Ace  wrap applied. The patient  awakened, extubated, and taken to recovery room without difficulty.   [name] 10/18/2012, 12:26 PM

## 2012-10-18 NOTE — Progress Notes (Signed)
Orthopedic Tech Progress Note Patient Details:  Mitchell Curry [birth year] [id number]  CPM Left Knee CPM Left Knee: On Left Knee Flexion (Degrees): 60 Left Knee Extension (Degrees): 0 Additional Comments: applied overhead frame to bed  Ortho Devices Ortho Device/Splint Location: footsie roll   Jennye Moccasin 10/18/2012, 3:49 PM

## 2012-10-18 NOTE — Anesthesia Postprocedure Evaluation (Signed)
Anesthesia Post Note  Patient: Mitchell Curry  Procedure(s) Performed: Procedure(s) (LRB): LEFT TOTAL KNEE ARTHROPLASTY (Left)  Anesthesia type: General  Patient location: PACU  Post pain: Pain level controlled and Adequate analgesia  Post assessment: Post-op Vital signs reviewed, Patient's Cardiovascular Status Stable, Respiratory Function Stable, Patent Airway and Pain level controlled  Last Vitals:  Filed Vitals:   10/18/12 1310  BP:   Pulse:   Temp: 36.1 C  Resp:     Post vital signs: Reviewed and stable  Level of consciousness: awake, alert  and oriented  Complications: No apparent anesthesia complications

## 2012-10-18 NOTE — Interval H&P Note (Signed)
History and Physical Interval Note:  10/18/2012 10:21 AM  Mitchell Curry  has presented today for surgery, with the diagnosis of LEFT KNEE OSTEOARTHRITIS VARUS  The various methods of treatment have been discussed with the patient and family. After consideration of risks, benefits and other options for treatment, the patient has consented to  Procedure(s): TOTAL KNEE ARTHROPLASTY (Left) as a surgical intervention .  The patient's history has been reviewed, patient examined, no change in status, stable for surgery.  I have reviewed the patient's chart and labs.  Questions were answered to the patient's satisfaction.     [name]

## 2012-10-19 DIAGNOSIS — M1712 Unilateral primary osteoarthritis, left knee: Secondary | ICD-10-CM | POA: Diagnosis present

## 2012-10-19 LAB — CBC
HCT: 29.2 % — ABNORMAL LOW (ref 39.0–52.0)
MCV: 91 fL (ref 78.0–100.0)
RBC: 3.21 MIL/uL — ABNORMAL LOW (ref 4.22–5.81)
WBC: 10.1 10*3/uL (ref 4.0–10.5)

## 2012-10-19 LAB — URINALYSIS, ROUTINE W REFLEX MICROSCOPIC
Glucose, UA: 100 mg/dL — AB
Ketones, ur: NEGATIVE mg/dL
Protein, ur: 30 mg/dL — AB

## 2012-10-19 LAB — URINE MICROSCOPIC-ADD ON

## 2012-10-19 MED ORDER — LISINOPRIL 10 MG PO TABS
10.0000 mg | ORAL_TABLET | Freq: Every day | ORAL | Status: DC
  Administered 2012-10-19 – 2012-10-20 (×2): 10 mg via ORAL
  Filled 2012-10-19 (×2): qty 1

## 2012-10-19 MED ORDER — HYDROCHLOROTHIAZIDE 12.5 MG PO CAPS
12.5000 mg | ORAL_CAPSULE | Freq: Every day | ORAL | Status: DC
  Administered 2012-10-19 – 2012-10-20 (×2): 12.5 mg via ORAL
  Filled 2012-10-19 (×2): qty 1

## 2012-10-19 NOTE — Evaluation (Signed)
Physical Therapy Evaluation Patient Details Name: Mitchell Curry MRN: [id number] DOB: [birth year] Today's Date: 10/19/2012 Time: 0840-0903 PT Time Calculation (min): 23 min  PT Assessment / Plan / Recommendation History of Present Illness  s/p elective Lt TKA  Clinical Impression  Pt is s/p Lt TKA POD#1 resulting in the deficits listed below (see PT Problem List).  Pt will benefit from skilled PT to increase their independence and safety with mobility to allow discharge to the venue listed below. Pt limited in evaluation due to nausea. Anticipate steady progress with mobility once nausea is under control. Pt highly motivated to D/C home tomorrow.      PT Assessment  Patient needs continued PT services    Follow Up Recommendations  Home health PT;Supervision/Assistance - 24 hour    Does the patient have the potential to tolerate intense rehabilitation      Barriers to Discharge        Equipment Recommendations  Rolling walker with 5" wheels;3in1 (PT)    Recommendations for Other Services     Frequency 7X/week    Precautions / Restrictions Precautions Precautions: Fall;Knee Precaution Booklet Issued: No Precaution Comments: reviewed precautions with pt Restrictions Weight Bearing Restrictions: Yes LLE Weight Bearing: Weight bearing as tolerated   Pertinent Vitals/Pain "no pain just nausea" Rn notified       Mobility  Bed Mobility Bed Mobility: Not assessed Supine to Sit: 5: Supervision;HOB flat;With rails Sitting - Scoot to Edge of Bed: 5: Supervision Details for Bed Mobility Assistance: supervision for cues and safety; cues for technique and hand placement; no physical (A) needed; incr time due to pain Transfers Transfers: Sit to Stand;Stand to Sit Sit to Stand: 4: Min guard;With upper extremity assist;From chair/3-in-1 Stand to Sit: 4: Min guard;With upper extremity assist;To chair/3-in-1 Details for Transfer Assistance: Min guard for safety. Cues for hand placement  and explained he can slide LLE out in front if it is too painful. Ambulation/Gait Ambulation/Gait Assistance: 4: Min guard Ambulation Distance (Feet): 14 Feet Assistive device: Rolling walker Ambulation/Gait Assistance Details: cues for gt sequencing and RW management; min guard for safety and cues  Gait Pattern: Step-to pattern;Decreased stance time - left;Decreased step length - right Gait velocity: decr due to pain and nausea  Stairs: No Wheelchair Mobility Wheelchair Mobility: No    Exercises Total Joint Exercises Ankle Circles/Pumps: AROM;Both;10 reps;Supine Quad Sets: AROM;Left;10 reps;Seated;Other (comment) (with footsie roll under lt ankle) Long Arc Quad: AROM;Left;10 reps;Seated Goniometric ROM: see assessment   PT Diagnosis: Difficulty walking;Acute pain  PT Problem List: Decreased strength;Decreased range of motion;Decreased balance;Decreased mobility;Decreased knowledge of use of DME;Pain PT Treatment Interventions: DME instruction;Gait training;Stair training;Functional mobility training;Therapeutic activities;Therapeutic exercise;Balance training;Neuromuscular re-education;Patient/family education     PT Goals(Current goals can be found in the care plan section) Acute Rehab PT Goals Patient Stated Goal: to go home PT Goal Formulation: With patient Potential to Achieve Goals: Good  Visit Information  Last PT Received On: 10/19/12 Assistance Needed: +1 History of Present Illness: s/p elective Lt TKA       Prior Functioning  Home Living Family/patient expects to be discharged to:: Private residence Living Arrangements: Alone Available Help at Discharge: Family;Available 24 hours/day Type of Home: House Home Access: Stairs to enter [employer] of Steps: 1 (threshold step) Home Layout: One level Home Equipment: Walker - 2 wheels;Bedside commode Additional Comments: tub shower with curtain; standard toilet seat height  Prior Function Level of  Independence: Independent Communication Communication: No difficulties Dominant Hand: Right  Cognition  Cognition Arousal/Alertness: Awake/alert Behavior During Therapy: WFL for tasks assessed/performed Overall Cognitive Status: Within Functional Limits for tasks assessed    Extremity/Trunk Assessment Upper Extremity Assessment Upper Extremity Assessment: Overall WFL for tasks assessed Lower Extremity Assessment Lower Extremity Assessment: LLE deficits/detail LLE Deficits / Details: grossly 3/5; knee AROM in sitting 50 degrees LLE: Unable to fully assess due to pain LLE Sensation:  (WFL to light touch) Cervical / Trunk Assessment Cervical / Trunk Assessment: Normal   Balance    End of Session PT - End of Session Equipment Utilized During Treatment: Gait belt Activity Tolerance: Other (comment) (limited by dizziniess and nausea) Patient left: in chair;with call bell/phone within reach;with family/visitor present Nurse Communication: Mobility status;Other (comment) (pt is nauseous) CPM Left Knee CPM Left Knee: Off Left Knee Flexion (Degrees): 60 Left Knee Extension (Degrees): 0  GP     Shelva Majestic [place], [place] [phone] 10/19/2012, 10:25 AM

## 2012-10-19 NOTE — Evaluation (Signed)
Occupational Therapy Evaluation Patient Details Name: Mitchell Curry MRN: [id number] DOB: [birth year] Today's Date: 10/19/2012 Time: 7829-5621 OT Time Calculation (min): 21 min  OT Assessment / Plan / Recommendation History of present illness s/p elective Lt TKA   Clinical Impression   Pt is s/p Lt TKA POD# 1. Pt did well during session and reports he will have 24/7 assist available at home. OT provided education. Feel pt is safe to d/c home with fiance/family to assist 24/7.      OT Assessment  Patient does not need any further OT services    Follow Up Recommendations  No OT follow up;Supervision/Assistance - 24 hour    Barriers to Discharge      Equipment Recommendations  None recommended by OT    Recommendations for Other Services    Frequency       Precautions / Restrictions Precautions Precautions: Fall;Knee Precaution Booklet Issued: No Precaution Comments: reviewed precautions with pt Restrictions Weight Bearing Restrictions: Yes LLE Weight Bearing: Weight bearing as tolerated   Pertinent Vitals/Pain Pain 6/10 in knee. Increased activity. Pt vomited during session.    ADL  Eating/Feeding: Independent Where Assessed - Eating/Feeding: Chair Grooming: Performed;Denture care;Teeth care;Wash/dry face;Set up;Supervision/safety Where Assessed - Grooming: Supported sitting;Unsupported standing;Supported standing Upper Body Bathing: Set up Where Assessed - Upper Body Bathing: Unsupported sitting Lower Body Bathing: Min guard Where Assessed - Lower Body Bathing: Supported sit to stand Upper Body Dressing: Set up Where Assessed - Upper Body Dressing: Unsupported sitting Lower Body Dressing: Min guard Where Assessed - Lower Body Dressing: Supported sit to Pharmacist, hospital: Hydrographic surveyor Method: Sit to Barista: Set designer - Architect and Hygiene: Min guard Where Assessed - Engineer, mining  and Hygiene: Standing Tub/Shower Transfer Method: Not assessed Equipment Used: Gait belt;Rolling walker Transfers/Ambulation Related to ADLs: Min guard for ambulation with cues for technique and to keep knee straight/not twist. Min guard for transfers ADL Comments: Pt became nauseaus during session, but did well. OT educated on tub transfer technique and pt/fiance feel good about it and he does not feel need to practice-planning to sponge bathe for approximately 2 weeks. Educated on dressing technique and safety with LB dressing and explained benefit of reaching straight down to don/doff left sock to increase ROM in knee.  OT explained having bag on walker to carry items and also to have rugs picked up in house for safety.    OT Diagnosis:    OT Problem List:   OT Treatment Interventions:     OT Goals(Current goals can be found in the care plan section) Acute Rehab OT Goals Patient Stated Goal: to go home  Visit Information  Last OT Received On: 10/19/12 Assistance Needed: +1 History of Present Illness: s/p elective Lt TKA       Prior Functioning     Home Living Family/patient expects to be discharged to:: Private residence Living Arrangements: Alone Available Help at Discharge: Family;Available 24 hours/day Type of Home: House Home Access: Stairs to enter [employer] of Steps: 1 (threshold step) Home Layout: One level Home Equipment: Walker - 2 wheels;Bedside commode Additional Comments: tub shower with curtain; standard toilet seat height  Prior Function Level of Independence: Independent Communication Communication: No difficulties Dominant Hand: Right         Vision/Perception     Cognition  Cognition Arousal/Alertness: Awake/alert Behavior During Therapy: WFL for tasks assessed/performed Overall Cognitive Status: Within Functional Limits for tasks assessed  Extremity/Trunk Assessment Upper Extremity Assessment Upper Extremity Assessment: Overall  WFL for tasks assessed     Mobility Bed Mobility Bed Mobility: Not assessed Transfers Transfers: Sit to Stand;Stand to Sit Sit to Stand: 4: Min guard;With upper extremity assist;From chair/3-in-1; With armrests Stand to Sit: 4: Min guard;With upper extremity assist;To chair/3-in-1; With armrests Details for Transfer Assistance: Min guard for safety. Cues for hand placement and explained he can slide LLE out in front if it is too painful.        Balance     End of Session OT - End of Session Equipment Utilized During Treatment: Gait belt;Rolling walker Activity Tolerance: Patient tolerated treatment well Patient left: in chair;with call bell/phone within reach;with family/visitor present Nurse Communication: Other (comment) (vomited during session) CPM Left Knee CPM Left Knee: Off   GO     Earlie Raveling OTR/L [phone] 10/19/2012, 10:43 AM

## 2012-10-19 NOTE — Progress Notes (Signed)
Dr. Janee Morn aware of pt's BP being elevated 189/96.  No new orders at this time.  Instructed to inform pt to see PCP regarding elevated BP readings.  Pt and pt's wife reported having an appointment with PCP next week in regards to BP readings.

## 2012-10-19 NOTE — Progress Notes (Signed)
Physical Therapy Treatment Patient Details Name: Mitchell Curry MRN: [id number] DOB: 04/13/1955 Today's Date: 10/19/2012 Time: 6213-0865 PT Time Calculation (min): 18 min  PT Assessment / Plan / Recommendation  History of Present Illness s/p elective Lt TKA   PT Comments   Pt making great progress with mobility. Was able to increase amb dsitance and theraex routine. No c/o dizziniess or nauseous this session. Pt highly motivated to D/C home tomorrow. Patient needs to practice stairs next session.   Follow Up Recommendations  Home health PT;Supervision/Assistance - 24 hour     Does the patient have the potential to tolerate intense rehabilitation     Barriers to Discharge        Equipment Recommendations  Rolling walker with 5" wheels;3in1 (PT)    Recommendations for Other Services    Frequency 7X/week   Progress towards PT Goals Progress towards PT goals: Progressing toward goals  Plan Current plan remains appropriate    Precautions / Restrictions Precautions Precautions: Knee Precaution Comments: reviewed HEP with pt  Restrictions Weight Bearing Restrictions: Yes LLE Weight Bearing: Weight bearing as tolerated   Pertinent Vitals/Pain 5/10; patient repositioned for comfort     Mobility  Bed Mobility Bed Mobility: Sit to Supine Sit to Supine: 5: Supervision;5: Set up;HOB flat Details for Bed Mobility Assistance: educated pt on using sheet or long towel to hook around Lt LE and use UEs to (A) LE onto bed; pt able to complete with cues after demo  Transfers Transfers: Sit to Stand;Stand to Sit Sit to Stand: 5: Supervision;From chair/3-in-1;With armrests Stand to Sit: 5: Supervision;To bed Details for Transfer Assistance: min cues for safety and cues for hand placement  Ambulation/Gait Ambulation/Gait Assistance: 5: Supervision Ambulation Distance (Feet): 150 Feet Assistive device: Rolling walker Ambulation/Gait Assistance Details: cues for gt sequencing; pt able to  progress to step through gt with cues and demo; relies heavily on UEs and has elevated shoulders and fwd flexed c-spine; educated on proper upright standing posture with amb and to squeeze middles traps and rhomboids for proper posture  Gait Pattern: Step-to pattern;Decreased stance time - left;Decreased step length - right;Step-through pattern Gait velocity: decr but begining to get [facility] with cues Stairs: No (will plan to attempt tomorrow) Wheelchair Mobility Wheelchair Mobility: No    Exercises Total Joint Exercises Ankle Circles/Pumps: AROM;Both;10 reps;Seated Heel Slides: AAROM;Left;10 reps;Seated Hip ABduction/ADduction: AROM;Left;10 reps;Supine Long Arc Quad: AROM;Left;10 reps;Seated   PT Diagnosis:    PT Problem List:   PT Treatment Interventions:     PT Goals (current goals can now be found in the care plan section) Acute Rehab PT Goals Patient Stated Goal: to go home tomorrow PT Goal Formulation: With patient Potential to Achieve Goals: Good  Visit Information  Last PT Received On: 10/19/12 Assistance Needed: +1 History of Present Illness: s/p elective Lt TKA    Subjective Data  Subjective: pt sitting in chair; " i knew you was coming back. Id like to get in bed after" Patient Stated Goal: to go home tomorrow   Cognition  Cognition Arousal/Alertness: Awake/alert Behavior During Therapy: WFL for tasks assessed/performed Overall Cognitive Status: Within Functional Limits for tasks assessed    Balance  Balance Balance Assessed: No  End of Session PT - End of Session Activity Tolerance: Patient tolerated treatment well Patient left: in bed;with call bell/phone within reach;with family/visitor present Nurse Communication: Mobility status   GP     Donell Sievert, [place] [phone] 10/19/2012, 2:15 PM

## 2012-10-19 NOTE — Progress Notes (Signed)
Subjective: POD 1 L TKA On CPM now, doing well, appears comfortable. Tol po, no flatus yet    Objective: Vital signs in last 24 hours: Temp:  [97 F (36.1 C)-98.5 F (36.9 C)] 98.5 F (36.9 C) (09/13 0540) Pulse Rate:  [58-97] 60 (09/13 0540) Resp:  [10-17] 16 (09/13 0759) BP: (157-254)/(85-158) 191/88 mmHg (09/13 0540) SpO2:  [100 %] 100 % (09/13 0759) FiO2 (%):  [28 %] 28 % (09/12 1622)  Intake/Output from previous day: 09/12 0701 - 09/13 0700 In: 2158.3 [I.V.:2158.3] Out: 2725 [Urine:1750; Drains:875; Blood:100] Intake/Output this shift:     Recent Labs  10/19/12 0418  HGB 10.4*    Recent Labs  10/19/12 0418  WBC 10.1  RBC 3.21*  HCT 29.2*  PLT 187   No results found for this basename: NA, K, CL, CO2, BUN, CREATININE, GLUCOSE, CALCIUM,  in the last 72 hours No results found for this basename: LABPT, INR,  in the last 72 hours  Sensation intact distally Intact pulses distally Dorsiflexion/Plantar flexion intact dressing clean/dry externally  Assessment/Plan: Continue joint pathway--CPM, PT, etc. Drain d/c'd today. Poss d/c tomorrow after PT   Mitchell Curry A. 10/19/2012, 8:12 AM

## 2012-10-20 LAB — CBC
HCT: 23.9 % — ABNORMAL LOW (ref 39.0–52.0)
Hemoglobin: 8.5 g/dL — ABNORMAL LOW (ref 13.0–17.0)
MCH: 32 pg (ref 26.0–34.0)
MCHC: 35.6 g/dL (ref 30.0–36.0)

## 2012-10-20 NOTE — Progress Notes (Signed)
Physical Therapy Treatment Patient Details Name: Mitchell Curry MRN: [id number] DOB: 1955-10-15 Today's Date: 10/20/2012 Time: 4696-2952 PT Time Calculation (min): 24 min  PT Assessment / Plan / Recommendation  History of Present Illness s/p elective Lt TKA   PT Comments   Pt is safe from mobility standpoint to D/C home today. Pt was supervision for stair amb with cues for sequencing. Reviewed HEP thoroughly and pt able to perform with min cues. Encouraged pt to stand and take a min before trying to amb due to new BP medications he began to reduce risk of falls and monitor orthostatics. Pt agreeable.   Follow Up Recommendations  Home health PT;Supervision/Assistance - 24 hour     Does the patient have the potential to tolerate intense rehabilitation     Barriers to Discharge        Equipment Recommendations  Rolling walker with 5" wheels;3in1 (PT)    Recommendations for Other Services    Frequency 7X/week   Progress towards PT Goals Progress towards PT goals: Progressing toward goals  Plan Current plan remains appropriate    Precautions / Restrictions Precautions Precautions: Knee Precaution Comments: reviewed HEP and reinforced no pillow under knee Restrictions Weight Bearing Restrictions: Yes LLE Weight Bearing: Weight bearing as tolerated   Pertinent Vitals/Pain 2-3/10; pt was premedicated and patient repositioned for comfort    Mobility  Bed Mobility Bed Mobility: Supine to Sit;Sitting - Scoot to Edge of Bed Supine to Sit: 6: Modified independent (Device/Increase time);HOB flat Sitting - Scoot to Edge of Bed: 6: Modified independent (Device/Increase time) Details for Bed Mobility Assistance: no cues or physical (A) needed Transfers Transfers: Sit to Stand;Stand to Sit Sit to Stand: 6: Modified independent (Device/Increase time);From bed Stand to Sit: 6: Modified independent (Device/Increase time);To chair/3-in-1;With armrests Details for Transfer Assistance: no  physical (A) or cues needed; pt demo good technique and safety with RW Ambulation/Gait Ambulation/Gait Assistance: 5: Supervision Ambulation Distance (Feet): 300 Feet Assistive device: Rolling walker Ambulation/Gait Assistance Details: min cues for gt sequencing and upright posture  Gait Pattern: Step-through pattern;Trunk flexed Gait velocity: slightly decreased Stairs: Yes Stairs Assistance: 5: Supervision Stairs Assistance Details (indicate cue type and reason): cues for technique and sequencing  Stair Management Technique: Two rails;Step to pattern;Forwards Number of Stairs: 3 Wheelchair Mobility Wheelchair Mobility: No    Exercises Total Joint Exercises Ankle Circles/Pumps: AROM;Both;10 reps;Seated Quad Sets: AROM;Left;10 reps;Seated;Other (comment) Heel Slides: AAROM;Left;10 reps;Seated Hip ABduction/ADduction: AROM;Both;10 reps;Seated Long Arc Quad: AROM;Left;10 reps;Seated Knee Flexion: AROM;10 reps;Left;Seated Goniometric ROM: AROM knee 90 degrees in sitting   PT Diagnosis:    PT Problem List:   PT Treatment Interventions:     PT Goals (current goals can now be found in the care plan section) Acute Rehab PT Goals Patient Stated Goal: home by 12 PT Goal Formulation: With patient Potential to Achieve Goals: Good  Visit Information  Last PT Received On: 10/20/12 Assistance Needed: +1 History of Present Illness: s/p elective Lt TKA    Subjective Data  Subjective: pt lying supine in bed; "im ready to get going. im leaving by 12, i hope" Patient Stated Goal: home by 12   Cognition  Cognition Arousal/Alertness: Awake/alert Behavior During Therapy: WFL for tasks assessed/performed Overall Cognitive Status: Within Functional Limits for tasks assessed    Balance     End of Session PT - End of Session Equipment Utilized During Treatment: Gait belt Activity Tolerance: Patient tolerated treatment well Patient left: in chair;with call bell/phone within reach;with  family/visitor present  Nurse Communication: Mobility status   GP     Donell Sievert, [place] [phone] 10/20/2012, 9:18 AM

## 2012-10-20 NOTE — Progress Notes (Signed)
Subjective: POD 1 L TKA Did well with PT BP less elevated now tol PO, + flatus Desires to go home    Objective: Vital signs in last 24 hours: Temp:  [97.7 F (36.5 C)-98.8 F (37.1 C)] 98.8 F (37.1 C) (09/14 1610) Pulse Rate:  [63-91] 90 (09/14 0613) Resp:  [16-18] 16 (09/14 0613) BP: (153-193)/(82-96) 153/82 mmHg (09/14 0613) SpO2:  [98 %-100 %] 100 % (09/14 9604)  Intake/Output from previous day: 09/13 0701 - 09/14 0700 In: 600 [P.O.:600] Out: 1200 [Urine:1200] Intake/Output this shift:     Recent Labs  10/19/12 0418 10/20/12 0507  HGB 10.4* 8.5*    Recent Labs  10/19/12 0418 10/20/12 0507  WBC 10.1 10.4  RBC 3.21* 2.66*  HCT 29.2* 23.9*  PLT 187 164   No results found for this basename: NA, K, CL, CO2, BUN, CREATININE, GLUCOSE, CALCIUM,  in the last 72 hours No results found for this basename: LABPT, INR,  in the last 72 hours  Sensation intact distally Intact pulses distally Dorsiflexion/Plantar flexion intact dressing clean/dry externally  Assessment/Plan: Continue joint pathway--CPM, PT, etc. Ok to D/C once clears PT Urged to keep f/u wtih PCP re: HTN   Heaton Sarin A. 10/20/2012, 7:45 AM

## 2012-10-21 LAB — URINE CULTURE

## 2012-10-21 NOTE — Progress Notes (Signed)
   CARE MANAGEMENT NOTE 10/21/2012  Patient:  Mitchell Curry   Account Number:  [id number]  Date Initiated:  10/21/2012  Documentation initiated by:  [facility]  Subjective/Objective Assessment:   LEFT TOTAL KNEE ARTHROPLASTY     Action/Plan:   lives at home with wife   Anticipated DC Date:  10/20/2012   Anticipated DC Plan:  HOME W HOME HEALTH SERVICES      DC Planning Services  CM consult      [facility] Choice  HOME HEALTH   Choice offered to / List presented to:  C-1 Patient        HH arranged  HH-2 PT      HH agency  [facility]   Status of service:  Completed, signed off Medicare Important Message given?   (If response is "NO", the following Medicare IM given date fields will be blank) Date Medicare IM given:   Date Additional Medicare IM given:    Discharge Disposition:  HOME W HOME HEALTH SERVICES  Per UR Regulation:    If discussed at Long Length of Stay Meetings, dates discussed:    Comments:  10/21/2012 0900 NCM spoke to pt and offered choice. Pt did not have preference. Contacted [facility] for acceptance of referral. [facility] will accept referral. Spoke to wife and states Mitchell Curry was suppose to do [facility] PT. Contacted [facility] and they do not service [facility]. Made wife aware. Mitchell Donning RN CCM Case Mgmt phone [phone]

## 2012-10-21 NOTE — Discharge Summary (Signed)
Patient ID: Mitchell Curry MRN: [id number] DOB/AGE: Jun 11, 1955 57 y.o.  Admit date: 10/18/2012 Discharge date: 10/21/2012  Admission Diagnoses:  Principal Problem:   Arthritis of knee, left   Discharge Diagnoses:  Same  Past Medical History  Diagnosis Date  . Hypertension     takes Carvedilol daily  . Arthritis   . Joint pain   . History of shingles     Surgeries: Procedure(s): LEFT TOTAL KNEE ARTHROPLASTY on 10/18/2012   Consultants:    Discharged Condition: Improved  Hospital Course: Mitchell Curry is an 57 y.o. male who was admitted 10/18/2012 for operative treatment ofArthritis of knee, left. Patient has severe unremitting pain that affects sleep, daily activities, and work/hobbies. After pre-op clearance the patient was taken to the operating room on 10/18/2012 and underwent  Procedure(s): LEFT TOTAL KNEE ARTHROPLASTY.    Patient was given perioperative antibiotics: Anti-infectives   Start     Dose/Rate Route Frequency Ordered Stop   10/18/12 1155  cefUROXime (ZINACEF) injection  Status:  Discontinued       As needed 10/18/12 1201 10/18/12 1321   10/18/12 0600  ceFAZolin (ANCEF) IVPB 2 g/50 mL premix     2 g 100 mL/hr over 30 Minutes Intravenous On call to O.R. 10/17/12 1442 10/18/12 1042       Patient was given sequential compression devices, early ambulation, and chemoprophylaxis to prevent DVT.  Patient benefited maximally from hospital stay and there were no complications.    Recent vital signs: No data found.    Recent laboratory studies:  Recent Labs  10/19/12 0418 10/20/12 0507  WBC 10.1 10.4  HGB 10.4* 8.5*  HCT 29.2* 23.9*  PLT 187 164     Discharge Medications:     Medication List    STOP taking these medications       naproxen sodium 220 MG tablet  Commonly known as:  ANAPROX      TAKE these medications       aspirin EC 325 MG tablet  Take 1 tablet (325 mg total) by mouth 2 (two) times daily.     carvedilol 25 MG tablet  Commonly  known as:  COREG  Take 25 mg by mouth 2 (two) times daily with a meal.     methocarbamol 500 MG tablet  Commonly known as:  ROBAXIN  Take 1 tablet (500 mg total) by mouth 2 (two) times daily with a meal.     multivitamin with minerals Tabs tablet  Take 1 tablet by mouth daily.     oxyCODONE-acetaminophen 5-325 MG per tablet  Commonly known as:  ROXICET  Take 1 tablet by mouth every 4 (four) hours as needed for pain.        Diagnostic Studies: Dg Chest 2 View  10/18/2012   *RADIOLOGY REPORT*  Clinical Data: Preoperative exam.  CHEST - 1  VIEW  Comparison: 90 09/25/2012 and 02/26/2006.  Findings: Nipple marker view reveals that previously noted nodules represent nipple shadows.  Minimal nodularity projecting over the anterior aspect of the right fourth rib unchanged since 2008.  No infiltrate, congestive heart failure or pneumothorax.  Heart size within normal limits.  Calcified slightly tortuous aorta.  Degenerative changes shoulders and of thoracic spine.  IMPRESSION: Nipple marker view confirms that previously noted nodules represent nipple shadows.  Please see above.   Original Report Authenticated By: Lacy Duverney, M.D.   Dg Chest 2 View  10/14/2012   *RADIOLOGY REPORT*  Clinical Data: Preoperative evaluation for total knee replacement,  history hypertension, occasional smoker  CHEST - 2 VIEW  Comparison: 02/26/2006  Findings: Normal heart size and pulmonary vascularity. Calcified mildly tortuous thoracic aorta. Lungs hyperinflated but clear. No pleural effusion or pneumothorax. Question bilateral nipple shadows, not seen on previous exam. Scattered endplate spur formation thoracic spine with minimal chronic-appearing height loss of a lower thoracic vertebra.  IMPRESSION: Hyperinflated lungs with question nipple shadows; repeat PA chest radiograph with nipple markers recommended to exclude pulmonary nodule.   Original Report Authenticated By: Ulyses Southward, M.D.    Disposition: 01-Home or Self  Care        Follow-up Information   Schedule an appointment as soon as possible for a visit with [name], MD. (approx 2 weeks postop)    Specialty:  Orthopedic Surgery   Contact information:   [address] [place] [address] [phone]        Signed: Vear Clock, Iolanda Folson R 10/21/2012, 2:39 PM

## 2012-10-22 ENCOUNTER — Encounter (HOSPITAL_COMMUNITY): Payer: Self-pay | Admitting: Orthopedic Surgery

## 2012-10-29 NOTE — Progress Notes (Signed)
SW received a consult for possible placement. PT  At this time is recommending home with HH and not SNF. Clinical Social Worker will sign off for now as social work intervention is no longer needed. Please consult us again if new need arises.   Timmey Lamba, MSW, LCSWA  [phone] 

## 2015-06-14 IMAGING — CR DG CHEST 2V
1 series · 1 of 1 positions shown · non-contrast
Comparison: [DATE] 09/25/2012 and 02/26/2006.

CLINICAL DATA: Preoperative exam.

CHEST - 1  VIEW

[w chest pa]
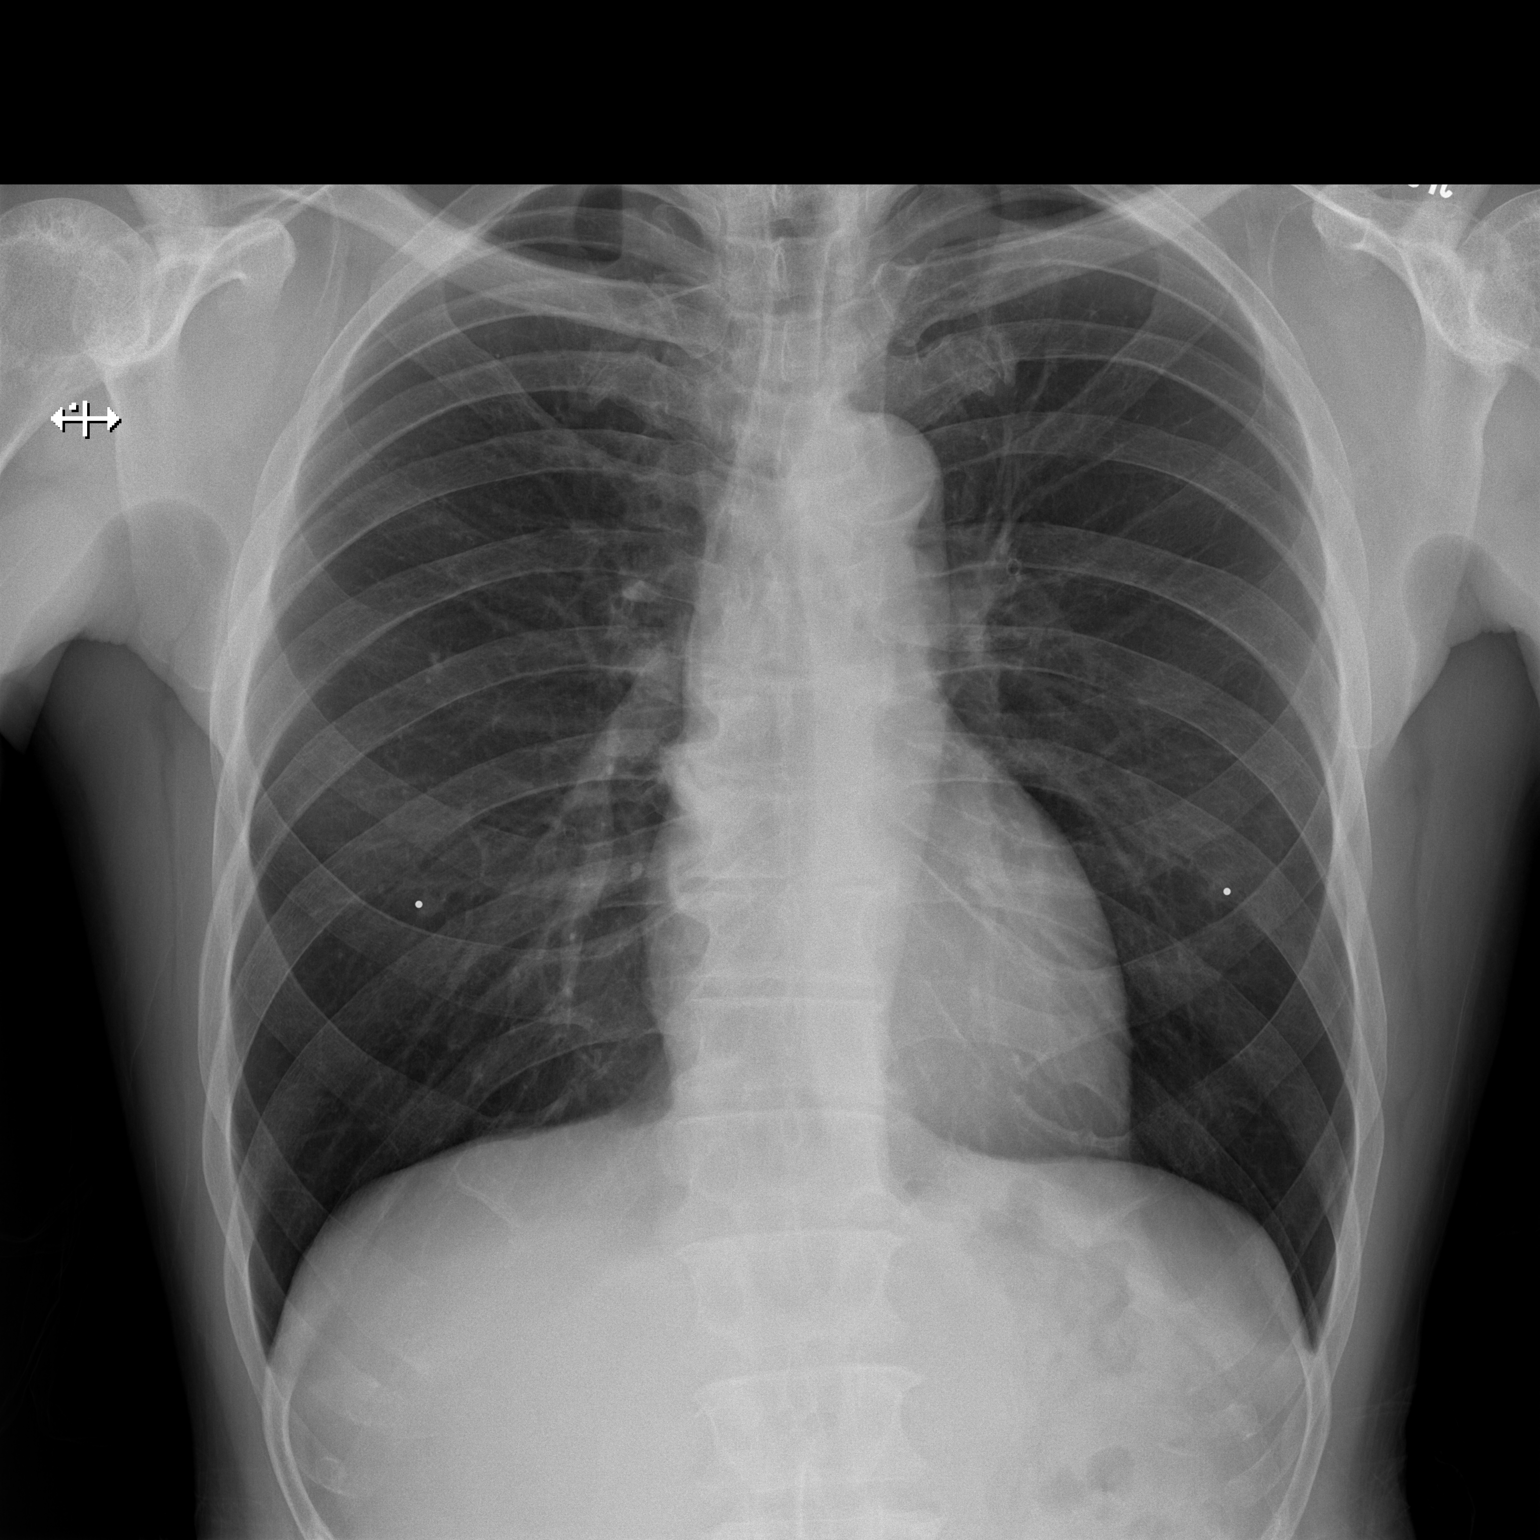

[1 of 1 positions shown; findings below may reference images not displayed]

FINDINGS: Nipple marker view reveals that previously noted nodules
represent nipple shadows.

Minimal nodularity projecting over the anterior aspect of the right
fourth rib unchanged since 0991.

No infiltrate, congestive heart failure or pneumothorax..

Heart size within normal limits.

Calcified slightly tortuous aorta.

Degenerative changes shoulders and of thoracic spine.
IMPRESSION: Nipple marker view confirms that previously noted nodules represent
nipple shadows.  Please see above.

## 2019-05-28 ENCOUNTER — Encounter: Payer: Self-pay | Admitting: Orthopaedic Surgery

## 2019-05-28 ENCOUNTER — Ambulatory Visit (INDEPENDENT_AMBULATORY_CARE_PROVIDER_SITE_OTHER): Payer: BC Managed Care – PPO

## 2019-05-28 ENCOUNTER — Ambulatory Visit (INDEPENDENT_AMBULATORY_CARE_PROVIDER_SITE_OTHER): Payer: BC Managed Care – PPO | Admitting: Orthopaedic Surgery

## 2019-05-28 ENCOUNTER — Other Ambulatory Visit: Payer: Self-pay

## 2019-05-28 VITALS — Ht 69.0 in | Wt 148.0 lb

## 2019-05-28 DIAGNOSIS — M79642 Pain in left hand: Secondary | ICD-10-CM | POA: Insufficient documentation

## 2019-05-28 NOTE — Progress Notes (Signed)
Office Visit Note   Patient: Mitchell Curry           Date of Birth: Feb 17, 1955           MRN: [id number] Visit Date: 05/28/2019              Requested by: Ignatius Specking, MD [address] [place],  [place] [address] PCP: Ignatius Specking, MD   Assessment & Plan: Visit Diagnoses:  1. Pain in left hand     Plan: Acute onset of left hand pain 5 days ago after an injury.  X-rays are negative for any fracture but there is considerable arthritis at the left nondominant thumb metacarpal phalangeal joint and carpometacarpal joint.  I think he is had exacerbation of the arthritis and will apply a splint and have him return in 3 to 4 weeks if no improvement consider cortisone injection at that point  Follow-Up Instructions: Return in about 3 weeks (around 06/18/2019).   Orders:  Orders Placed This Encounter  Procedures  . XR Hand Complete Left   No orders of the defined types were placed in this encounter.     Procedures: No procedures performed   Clinical Data: No additional findings.   Subjective: Chief Complaint  Patient presents with  . Left Hand - Pain, Injury  Patient presents today for left hand pain. He injured his hand on 05/24/2019 while moving his lawnmower and caught his left hand on the patio. His pain is located at the base of his thumb and swollen. He has a very difficult time moving his thumb. He is not taking anything for pain. He has not had it evaluated anywhere before today. He is right hand dominant.   HPI  Review of Systems   Objective: Vital Signs: Ht 5\' 9"  (1.753 m)   Wt 148 lb (67.1 kg)   BMI 21.86 kg/m   Physical Exam Constitutional:      Appearance: He is well-developed.  Eyes:     Pupils: Pupils are equal, round, and reactive to light.  Pulmonary:     Effort: Pulmonary effort is normal.  Skin:    General: Skin is warm and dry.  Neurological:     Mental Status: He is alert and oriented to person, place, and time.  Psychiatric:        Behavior:  Behavior normal.     Ortho Exam awake alert and oriented x3.  Comfortable sitting.  Left dominant thumb was swollen at the base but minimal discomfort.  Good capillary refill to the finger.  Positive grind test at the base of the thumb.  Skin intact.  No particular pain at the metacarpal phalangeal joint but there was moderate pain at the carpometacarpal joint of the thumb.  No wrist pain.  Specialty Comments:  No specialty comments available.  Imaging: XR Hand Complete Left  Result Date: 05/28/2019 Films of the left nondominant hand were obtained in 3 projections.  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  No acute changes.  Joints are located    PMFS History: Patient Active Problem List   Diagnosis Date Noted  . Pain in left hand 05/28/2019  . Arthritis of knee, left 10/19/2012   Past Medical History:  Diagnosis Date  . Arthritis   . History of shingles   . Hypertension    takes Carvedilol daily  . Joint pain     History reviewed.  No pertinent family history.  Past Surgical History:  Procedure Laterality Date  . right wrist surgery      x 2  . TOTAL KNEE ARTHROPLASTY Left 10/18/2012   Procedure: LEFT TOTAL KNEE ARTHROPLASTY;  Surgeon: Kerin Salen, MD;  Location: [facility];  Service: Orthopedics;  Laterality: Left;   Social History   Occupational History  . Not on file  Tobacco Use  . Smoking status: Current Some Day Smoker    Types: Cigars  . Smokeless tobacco: Never Used  Substance and Sexual Activity  . Alcohol use: Yes    Comment: beer on weekends  . Drug use: No  . Sexual activity: Yes

## 2019-06-18 ENCOUNTER — Other Ambulatory Visit: Payer: Self-pay

## 2019-06-18 ENCOUNTER — Encounter: Payer: Self-pay | Admitting: Orthopaedic Surgery

## 2019-06-18 ENCOUNTER — Ambulatory Visit (INDEPENDENT_AMBULATORY_CARE_PROVIDER_SITE_OTHER): Payer: BC Managed Care – PPO | Admitting: Orthopaedic Surgery

## 2019-06-18 VITALS — BP 211/122 | HR 63

## 2019-06-18 DIAGNOSIS — F17210 Nicotine dependence, cigarettes, uncomplicated: Secondary | ICD-10-CM

## 2019-06-18 DIAGNOSIS — M79642 Pain in left hand: Secondary | ICD-10-CM

## 2019-06-18 NOTE — Progress Notes (Signed)
My hand is much better  He has no pain of the left hand. He wants to return to work.  He is patient of Dr. Cleophas Dunker.  He has full ROM and no pain and no swelling of the left hand.  NV intact.  Encounter Diagnoses  Name Primary?  . Left hand pain Yes  . Nicotine dependence, cigarettes, uncomplicated    I will give note to return to work.  Discharge.  Call if any problem.  Precautions discussed.   Electronically Signed Darreld Mclean, MD 5/12/202110:27 AM

## 2019-06-18 NOTE — Progress Notes (Signed)
Note to return to work

## 2019-06-18 NOTE — Patient Instructions (Addendum)

## 2022-03-20 ENCOUNTER — Encounter: Payer: Self-pay | Admitting: *Deleted

## 2022-03-21 ENCOUNTER — Encounter: Payer: Self-pay | Admitting: Internal Medicine

## 2022-03-21 ENCOUNTER — Encounter: Payer: BC Managed Care – PPO | Admitting: Internal Medicine

## 2022-03-21 NOTE — Progress Notes (Signed)
Erroneous encounter - please disregard

## 2022-05-16 ENCOUNTER — Ambulatory Visit: Payer: BC Managed Care – PPO | Admitting: Internal Medicine

## 2022-05-26 ENCOUNTER — Ambulatory Visit: Payer: BC Managed Care – PPO | Admitting: Internal Medicine

## 2022-06-13 ENCOUNTER — Encounter: Payer: Self-pay | Admitting: Internal Medicine

## 2022-06-13 ENCOUNTER — Encounter: Payer: BC Managed Care – PPO | Admitting: Internal Medicine

## 2022-06-13 NOTE — Progress Notes (Signed)
Erroneous encounter - please disregard.
